# Patient Record
Sex: Male | Born: 2000 | Race: Black or African American | Hispanic: No | Marital: Single | Smoking: Never smoker
Health system: Southern US, Community
[De-identification: ages and names within clinical notes are randomized; demographics above are authoritative.]

## PROBLEM LIST (undated history)

## (undated) DIAGNOSIS — J45909 Unspecified asthma, uncomplicated: Secondary | ICD-10-CM

## (undated) DIAGNOSIS — F909 Attention-deficit hyperactivity disorder, unspecified type: Secondary | ICD-10-CM

---
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
## 2021-02-25 ENCOUNTER — Other Ambulatory Visit: Payer: Self-pay

## 2021-02-25 ENCOUNTER — Ambulatory Visit (HOSPITAL_COMMUNITY)
Admission: EM | Admit: 2021-02-25 | Discharge: 2021-02-25 | Disposition: A | Discharge status: Home / Self Care | Payer: Medicaid Other | Attending: Family Medicine | Admitting: Family Medicine

## 2021-02-25 ENCOUNTER — Encounter (HOSPITAL_COMMUNITY): Payer: Self-pay

## 2021-02-25 ENCOUNTER — Ambulatory Visit (INDEPENDENT_AMBULATORY_CARE_PROVIDER_SITE_OTHER): Payer: Medicaid Other

## 2021-02-25 DIAGNOSIS — S60222A Contusion of left hand, initial encounter: Secondary | ICD-10-CM | POA: Diagnosis not present

## 2021-02-25 DIAGNOSIS — M79642 Pain in left hand: Secondary | ICD-10-CM

## 2021-02-25 NOTE — Discharge Instructions (Signed)
If not allergic, you may use over the counter ibuprofen or acetaminophen as needed. ° °
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
## 2021-12-20 ENCOUNTER — Ambulatory Visit (HOSPITAL_COMMUNITY)
Admission: EM | Admit: 2021-12-20 | Discharge: 2021-12-20 | Disposition: A | Discharge status: Home / Self Care | Payer: Medicaid Other | Attending: Nurse Practitioner | Admitting: Nurse Practitioner

## 2021-12-20 ENCOUNTER — Encounter (HOSPITAL_COMMUNITY): Payer: Self-pay

## 2021-12-20 DIAGNOSIS — J0110 Acute frontal sinusitis, unspecified: Secondary | ICD-10-CM

## 2021-12-20 MED ORDER — AMOXICILLIN 500 MG PO CAPS: 500.0000 mg | ORAL_CAPSULE | Freq: Two times a day (BID) | ORAL | 0 refills | Status: AC

## 2021-12-20 NOTE — ED Triage Notes (Signed)
Pt presents to the office for body aches, headache and cough. Pt is taking otc medication with no relief.

## 2021-12-20 NOTE — Discharge Instructions (Addendum)
You may have a sinus infection.  Amoxicillin 500 mg BID for 10 days  We encourage conservative treatment with symptom relief. We encourage you to use Tylenol alternating with Ibuprofen for your fever if not contraindicated. (Remember to use as directed do not exceed daily dosing recommendations) Your cough can be soothed with a cough suppressant. Marland Kitchen

## 2021-12-20 NOTE — ED Provider Notes (Signed)
[facility]    CSN: [id number] Arrival date & time: 12/20/21  1048      History   Chief Complaint Chief Complaint  Patient presents with   Generalized Body Aches   Cough   Headache    HPI Monnie Anspach is a 21 y.o. male.   HPI  He is complaining of body ache, headache and cough for one week. He works at [employer] and has been out of work. He has not had a recent fever. He reports that the OTC medication is not effective. Denies new sore throat, new loss of smell or taste, shortness of breath, chest pain, nausea, or diarrhea. Denies exposure COVID/Influenza/Strep.  Past Medical History:  Diagnosis Date   ADHD (attention deficit hyperactivity disorder)    Asthma     Patient Active Problem List   Diagnosis Date Noted   Impacted cerumen of left ear 08/11/2016    History reviewed. No pertinent surgical history.     Home Medications    Prior to Admission medications   Not on File    Family History Family History  Problem Relation Age of Onset   Healthy Mother    Healthy Father     Social History Social History   Tobacco Use   Smoking status: Never   Smokeless tobacco: Never  Substance Use Topics   Alcohol use: No   Drug use: No     Allergies   Patient has no known allergies.   Review of Systems Review of Systems   Physical Exam Triage Vital Signs ED Triage Vitals [12/20/21 1158]  Enc Vitals Group     BP 117/74     Pulse Rate 92     Resp 18     Temp 98.5 F (36.9 C)     Temp Source Oral     SpO2 98 %     Weight      Height      Head Circumference      Peak Flow      Pain Score      Pain Loc      Pain Edu?      Excl. in [place]?    No data found.  Updated Vital Signs BP 117/74 (BP Location: Left Arm)   Pulse 92   Temp 98.5 F (36.9 C) (Oral)   Resp 18   SpO2 98%   Visual Acuity Right Eye Distance:   Left Eye Distance:   Bilateral Distance:    Right Eye Near:   Left Eye Near:    Bilateral  Near:     Physical Exam Constitutional:      General: He is not in acute distress. HENT:     Head: Normocephalic and atraumatic.     Mouth/Throat:     Mouth: Mucous membranes are moist.  Eyes:     Extraocular Movements: Extraocular movements intact.  Cardiovascular:     Rate and Rhythm: Normal rate and regular rhythm.     Heart sounds: Normal heart sounds.  Pulmonary:     Effort: Pulmonary effort is normal.     Breath sounds: Normal breath sounds.  Musculoskeletal:        General: Normal range of motion.     Cervical back: Normal range of motion.  Skin:    General: Skin is warm and dry.     Capillary Refill: Capillary refill takes less than 2 seconds.  Neurological:     Mental Status: He is alert.  UC Treatments / Results  Labs (all labs ordered are listed, but only abnormal results are displayed) Labs Reviewed - No data to display  EKG   Radiology No results found.  Procedures Procedures (including critical care time)  Medications Ordered in UC Medications - No data to display  Initial Impression / Assessment and Plan / UC Course  I have reviewed the triage vital signs and the nursing notes.  Pertinent labs & imaging results that were available during my care of the patient were reviewed by me and considered in my medical decision making (see chart for details).     Nasal congestion Final Clinical Impressions(s) / UC Diagnoses   Final diagnoses:  Acute frontal sinusitis, recurrence not specified     Discharge Instructions      You may have a sinus infection.  We encourage conservative treatment with symptom relief. We encourage you to use Tylenol alternating with Ibuprofen for your fever if not contraindicated. (Remember to use as directed do not exceed daily dosing recommendations) We also encourage salt water gargles for your sore throat. You should also consider throat lozenges and chloraseptic spray.  Your cough can be soothed with a cough  suppressant. .       ED Prescriptions   None    PDMP not reviewed this encounter.   Dionisio David Rodanthe, NP 12/20/21 1230

## 2022-04-26 ENCOUNTER — Encounter (HOSPITAL_COMMUNITY): Payer: Self-pay

## 2022-04-26 ENCOUNTER — Ambulatory Visit (HOSPITAL_COMMUNITY)
Admission: EM | Admit: 2022-04-26 | Discharge: 2022-04-26 | Disposition: A | Discharge status: Home / Self Care | Payer: Medicaid Other | Attending: Internal Medicine | Admitting: Internal Medicine

## 2022-04-26 ENCOUNTER — Ambulatory Visit (INDEPENDENT_AMBULATORY_CARE_PROVIDER_SITE_OTHER): Payer: Medicaid Other

## 2022-04-26 DIAGNOSIS — M79641 Pain in right hand: Secondary | ICD-10-CM

## 2022-04-26 DIAGNOSIS — M79644 Pain in right finger(s): Secondary | ICD-10-CM

## 2022-04-26 MED ORDER — IBUPROFEN 800 MG PO TABS
ORAL_TABLET | ORAL | Status: AC
  Filled 2022-04-26: qty 1

## 2022-04-26 MED ORDER — IBUPROFEN 800 MG PO TABS
800.0000 mg | ORAL_TABLET | Freq: Once | ORAL | Status: AC
  Administered 2022-04-26: 800 mg via ORAL
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
## 2022-04-26 NOTE — Discharge Instructions (Addendum)
Your x-rays of your right thumb were negative for fracture or dislocation. You likely sprained your right thumb.   Please rest, ice, and elevate your thumb to help it heal and decrease inflammation.   Take '600mg'$  ibuprofen and/or 1,'000mg'$  tylenol every 6 hours as needed for pain and inflammation. Take with food to avoid stomach upset.  Return to urgent care if you experience worsening pain, numbness, tingling, change of color in your skin near the injury, or any other concerning symptoms.  I hope you feel better!

## 2022-06-16 ENCOUNTER — Other Ambulatory Visit: Payer: Self-pay | Admitting: Nurse Practitioner

## 2022-06-16 DIAGNOSIS — R109 Unspecified abdominal pain: Secondary | ICD-10-CM

## 2022-07-01 ENCOUNTER — Ambulatory Visit
Admission: RE | Admit: 2022-07-01 | Discharge: 2022-07-01 | Disposition: A | Discharge status: Home / Self Care | Payer: Medicaid Other | Source: Ambulatory Visit | Attending: Nurse Practitioner | Admitting: Nurse Practitioner

## 2022-07-01 DIAGNOSIS — R109 Unspecified abdominal pain: Secondary | ICD-10-CM
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
## 2024-03-10 ENCOUNTER — Ambulatory Visit (HOSPITAL_COMMUNITY): Admission: EM | Admit: 2024-03-10 | Discharge: 2024-03-10 | Disposition: A | Discharge status: Home / Self Care

## 2024-03-10 ENCOUNTER — Encounter (HOSPITAL_COMMUNITY): Payer: Self-pay

## 2024-03-10 ENCOUNTER — Ambulatory Visit (INDEPENDENT_AMBULATORY_CARE_PROVIDER_SITE_OTHER)

## 2024-03-10 DIAGNOSIS — J3489 Other specified disorders of nose and nasal sinuses: Secondary | ICD-10-CM | POA: Diagnosis not present

## 2024-03-10 DIAGNOSIS — S0992XA Unspecified injury of nose, initial encounter: Secondary | ICD-10-CM | POA: Diagnosis not present

## 2024-03-10 MED ORDER — NAPROXEN 500 MG PO TABS: 500.0000 mg | ORAL_TABLET | Freq: Two times a day (BID) | ORAL | 0 refills | Status: AC

## 2024-03-10 NOTE — Discharge Instructions (Signed)
 Your x-rays are negative for fracture of the nose. The swelling will go down in the next 3-5 days with use of ice 20 minutes on 20 minutes off to the nose.  Take naproxen  every 12 hours for the next 3-5 days.  Take naproxen  with food to avoid stomach upset.   Return to clinic if you can't breathe through your nose.   If you develop any new or worsening symptoms or if your symptoms do not start to improve, please return here or follow-up with your primary care provider. If your symptoms are severe, please go to the emergency room.
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
